# Patient Record
Sex: Male | Born: 1970 | Race: Black or African American | Hispanic: No | Marital: Single | Smoking: Current some day smoker
Health system: Southern US, Community
[De-identification: ages and names within clinical notes are randomized; demographics above are authoritative.]

## PROBLEM LIST (undated history)

## (undated) DIAGNOSIS — S82899A Other fracture of unspecified lower leg, initial encounter for closed fracture: Secondary | ICD-10-CM

## (undated) DIAGNOSIS — I10 Essential (primary) hypertension: Secondary | ICD-10-CM

## (undated) DIAGNOSIS — E119 Type 2 diabetes mellitus without complications: Secondary | ICD-10-CM

## (undated) DIAGNOSIS — E559 Vitamin D deficiency, unspecified: Secondary | ICD-10-CM

## (undated) DIAGNOSIS — E785 Hyperlipidemia, unspecified: Secondary | ICD-10-CM

## (undated) DIAGNOSIS — M109 Gout, unspecified: Secondary | ICD-10-CM

## (undated) DIAGNOSIS — E291 Testicular hypofunction: Secondary | ICD-10-CM

## (undated) HISTORY — DX: Type 2 diabetes mellitus without complications: E11.9

## (undated) HISTORY — DX: Hyperlipidemia, unspecified: E78.5

## (undated) HISTORY — PX: KNEE SURGERY: SHX244

## (undated) HISTORY — DX: Vitamin D deficiency, unspecified: E55.9

## (undated) HISTORY — DX: Testicular hypofunction: E29.1

## (undated) HISTORY — DX: Morbid (severe) obesity due to excess calories: E66.01

---

## 2010-10-20 ENCOUNTER — Emergency Department (HOSPITAL_COMMUNITY): Payer: Managed Care, Other (non HMO)

## 2010-10-20 ENCOUNTER — Emergency Department (HOSPITAL_COMMUNITY)
Admission: EM | Admit: 2010-10-20 | Discharge: 2010-10-20 | Disposition: A | Discharge status: Home / Self Care | Payer: Managed Care, Other (non HMO) | Attending: Emergency Medicine | Admitting: Emergency Medicine

## 2010-10-20 DIAGNOSIS — I10 Essential (primary) hypertension: Secondary | ICD-10-CM | POA: Insufficient documentation

## 2010-10-20 DIAGNOSIS — X500XXA Overexertion from strenuous movement or load, initial encounter: Secondary | ICD-10-CM | POA: Insufficient documentation

## 2010-10-20 DIAGNOSIS — M25569 Pain in unspecified knee: Secondary | ICD-10-CM | POA: Insufficient documentation

## 2010-10-20 DIAGNOSIS — IMO0002 Reserved for concepts with insufficient information to code with codable children: Secondary | ICD-10-CM | POA: Insufficient documentation

## 2016-08-19 ENCOUNTER — Encounter (HOSPITAL_BASED_OUTPATIENT_CLINIC_OR_DEPARTMENT_OTHER): Payer: Self-pay | Admitting: *Deleted

## 2016-08-19 ENCOUNTER — Emergency Department (HOSPITAL_BASED_OUTPATIENT_CLINIC_OR_DEPARTMENT_OTHER): Payer: Managed Care, Other (non HMO)

## 2016-08-19 ENCOUNTER — Emergency Department (HOSPITAL_BASED_OUTPATIENT_CLINIC_OR_DEPARTMENT_OTHER)
Admission: EM | Admit: 2016-08-19 | Discharge: 2016-08-19 | Disposition: A | Discharge status: Home / Self Care | Payer: Managed Care, Other (non HMO) | Attending: Emergency Medicine | Admitting: Emergency Medicine

## 2016-08-19 DIAGNOSIS — I10 Essential (primary) hypertension: Secondary | ICD-10-CM | POA: Diagnosis not present

## 2016-08-19 DIAGNOSIS — F17290 Nicotine dependence, other tobacco product, uncomplicated: Secondary | ICD-10-CM | POA: Insufficient documentation

## 2016-08-19 DIAGNOSIS — M7989 Other specified soft tissue disorders: Secondary | ICD-10-CM

## 2016-08-19 HISTORY — DX: Gout, unspecified: M10.9

## 2016-08-19 HISTORY — DX: Other fracture of unspecified lower leg, initial encounter for closed fracture: S82.899A

## 2016-08-19 HISTORY — DX: Essential (primary) hypertension: I10

## 2016-08-19 NOTE — ED Notes (Signed)
Pt discharged to home with family. NAD.  

## 2016-08-19 NOTE — ED Provider Notes (Signed)
[facility] Note   CSN: [id number] Arrival date & time: 08/19/16  1548   By signing my name below, I, Soijett Blue, attest that this documentation has been prepared under the direction and in the presence of Sharen Hecklaudia Nakiea Metzner, PA-C Electronically Signed: Soijett Blue, ED Scribe. 08/19/16. 6:26 PM.  History   Chief Complaint Chief Complaint  Patient presents with  . Leg Swelling    right    HPI Juan Alvarado is a 46 y.o. male with a PMHx of HTN, gout and obesity who presents to the Emergency Department complaining of gradually worsening, right lower leg swelling onset today. Pt reports associated right ankle swelling x 3 days.  Patient also reports sudden onset cramping sensation behind left knee. Pt has not tried any medications for the relief of his symptoms. He states that he stands for prolonged periods of time (10-12 hours) while at work daily. He denies CP, chest tightness, SOB, orthopnea, cough or h/o heart failure or known CAD.  Denies recent travel, recent trauma, recent injury, immobilization, surgery, PMHx of blood clots, or PMHx of cardiac issues. He reports that he occassionally smokes cigars. Patient reports LE swelling is better in the mornings and worst at the end of the day and after work.   The history is provided by the patient and the spouse. No language interpreter was used.    Past Medical History:  Diagnosis Date  . Ankle fracture    right  . Gout   . Hypertension     There are no active problems to display for this patient.   Past Surgical History:  Procedure Laterality Date  . KNEE SURGERY         Home Medications    Prior to Admission medications   Medication Sig Start Date End Date Taking? Authorizing Provider  Amlodipine-Olmesartan (AZOR PO) Take by mouth.   Yes [provider]  INDOMETHACIN PO Take by mouth.   Yes [provider]  testosterone (ANDROGEL) 50 MG/5GM (1%) GEL Place 5 g onto the skin daily.    Yes [provider]    Family History No family history on file.  Social History Social History  Substance Use Topics  . Smoking status: Current Some Day Smoker    Types: Cigars  . Smokeless tobacco: Never Used  . Alcohol use Yes     Comment: occassional     Allergies   Penicillins   Review of Systems Review of Systems  Respiratory: Negative for chest tightness and shortness of breath.   Cardiovascular: Positive for leg swelling (right lower). Negative for chest pain.  Musculoskeletal: Positive for joint swelling (right ankle). Negative for arthralgias.       +Cramping sensation behind left knee     Physical Exam Updated Vital Signs BP (!) 139/97 (BP Location: Right Arm)   Pulse 79   Temp 98.5 F (36.9 C) (Oral)   Resp (!) 22   Ht 6' (1.829 m)   Wt 252 lb (114.3 kg)   SpO2 100%   BMI 34.18 kg/m   Physical Exam  Constitutional: He is oriented to person, place, and time. He appears well-developed and well-nourished. No distress.  HENT:  Head: Normocephalic and atraumatic.  Eyes: Conjunctivae and EOM are normal. No scleral icterus.  Neck: Normal range of motion.  Cardiovascular: Normal rate, regular rhythm, normal heart sounds and intact distal pulses.   No murmur heard. Asymmetric lower extremity edema R> L. No varicosities seen. No calf tenderness. 2+ and  symmetric radial, dorsalis pedis and posterior tibial pulses bilaterally.   SBP and HR wnl. No JVD with head of bed at 30 degrees. Carotid pulses 2+ bilaterally without bruits. Good S1 and S2. No extra sounds or murmurs. Patient denied shortness of breath, throat congestion or difficulty breathing with head of the bed flat. No orthopnea.  Good lung sounds without crackles.  Pulmonary/Chest: Effort normal and breath sounds normal. He has no wheezes.  Musculoskeletal: Normal range of motion. He exhibits edema. He exhibits no deformity.       Right shoulder: He exhibits swelling.       Left upper  leg: He exhibits tenderness.       Right lower leg: He exhibits edema.  Mild right lower leg edema that is non-pitting. No overlying or surrounding skin changes, bruising, or abrasions.  Mild left popliteal space tenderness, no noted masses or bulges  Lymphadenopathy:    He has no cervical adenopathy.  Neurological: He is alert and oriented to person, place, and time.  Gait normal. No foot drop.  5/5 strength with knee flexion and extension, bilaterally.  5/5 strength with ankle dorsiflexion and plantar flexion, bilaterally.  Sensation to light touch intact in the distribution of the obturator nerve, lateral cutaneous nerve, femoral nerve, common fibular nerve.   Foot: sensation to light touch intact in the distribution of the saphenous nerve, medial plantar nerve, lateral plantar nerve, bilaterally.   Skin: Skin is warm and dry. Capillary refill takes less than 2 seconds.  Psychiatric: He has a normal mood and affect. His behavior is normal. Judgment and thought content normal.  Nursing note and vitals reviewed.    ED Treatments / Results  DIAGNOSTIC STUDIES: Oxygen Saturation is 100% on RA, nl by my interpretation.    COORDINATION OF CARE: 8:55 PM Discussed treatment plan with pt at bedside which includes US venous lower bilateral and pt agreed to plan.   Labs (all labs ordered are listed, but only abnormal results are displayed) Labs Reviewed - No data to display  EKG  EKG Interpretation None       Radiology US Venous Img Lower Bilateral  Result Date: 08/19/2016 CLINICAL DATA:  46 year old male with acute bilateral lower leg pain and swelling for 3 days. Initial encounter. EXAM: BILATERAL LOWER EXTREMITY VENOUS DOPPLER ULTRASOUND TECHNIQUE: Gray-scale sonography with graded compression, as well as color Doppler and duplex ultrasound were performed to evaluate the lower extremity deep venous systems from the level of the common femoral vein and including the common femoral,  femoral, profunda femoral, popliteal and calf veins including the posterior tibial, peroneal and gastrocnemius veins when visible. The superficial great saphenous vein was also interrogated. Spectral Doppler was utilized to evaluate flow at rest and with distal augmentation maneuvers in the common femoral, femoral and popliteal veins. COMPARISON:  None. FINDINGS: RIGHT LOWER EXTREMITY Normal flow, compressibility, and augmentation within the distal common femoral, proximal profunda femoral, proximal greater saphenous, entire femoral, popliteal veins, and imaged calf veins. LEFT LOWER EXTREMITY Normal flow, compressibility, and augmentation within the distal common femoral, proximal profunda femoral, proximal greater saphenous, entire femoral, popliteal veins, and imaged calf veins. IMPRESSION: No evidence of DVT within either lower extremity. Electronically Signed   By: Harmon Pier M.D.   On: 08/19/2016 17:34    Procedures Procedures (including critical care time)  Medications Ordered in ED Medications - No data to display   Initial Impression / Assessment and Plan / ED Course  I have reviewed the triage vital  signs and the nursing notes.  Pertinent labs & imaging results that were available during my care of the patient were reviewed by me and considered in my medical decision making (see chart for details).    46 year old male with history of hypertension, gout and obesity presents with gradually worsening right lower extremity edema 3 days. The patient also reports sudden onset left popliteal space "cramping" sensation since yesterday. No history of DVT/PE. No history of congestive heart failure or known CAD. Blood pressure is well-controlled with medications. No trauma. No fevers. No IVDU.  On my exam vital signs are within normal limits. Patient is nontoxic appearing. There is mild, nonpitting right lower extremity edema from ankle to knee, which is nontender. No overlying skin changes to  suggest cellulitis. No point tenderness over the lower extremity joints to suggest gout. Patient reports very mild tenderness to left popliteal space, no masses or bulges. Cardiac exam is normal, no crackles on lung exam, no orthopnea.  Ultrasound of bilateral lower extremities ordered and negative for DVT. Highly suspecting venous insufficiency given history of patient working on his feet for 10-12 hours daily. Lower extremity swelling does also improve in the mornings and worsens at the end of the day. Further emergent lab work or imaging indicated at this time. Patient will be discharged and advised to follow-up with PCP.  Patient may benefit from lasix which could be started and monitored by PCP. Will advised to wear compression stockings, elevate feet at the end of the day. ED return fashions given.  Final Clinical Impressions(s) / ED Diagnoses   Final diagnoses:  Leg swelling    New Prescriptions Discharge Medication List as of 08/19/2016  7:06 PM     I personally performed the services described in this documentation, which was scribed in my presence. The recorded information has been reviewed and is accurate.     Liberty Handy, PA-C 08/19/16 2055    Raeford Razor, MD 08/21/16 1028

## 2016-08-19 NOTE — Discharge Instructions (Signed)
Your ultrasound of your legs did not show a deep venous thrombosis (blood clot in your veins).  The most likely cause of your lower extremity swelling is venous insufficiency.    Please wear compression socks while standing up and working.  Elevate your feet during the day and especially at night time.  Contact your primary care provider as he may suggest starting fluid pills and possible referral to vascular specialist.   Please read attached information on edema, venous insufficiency and deep venous thrombosis. Monitor your symptoms and return to the emergency department if you develop pain or cramps in your calves as this may be due to a blood clot in your legs. Read the attached information so you are aware of symptoms that would warrant return to the emergency department for further evaluation.

## 2016-08-19 NOTE — ED Triage Notes (Signed)
Patient states he has a three day history of right ankle swelling.  States this morning he developed swelling in the right lower leg.  Denies pain in the right leg.  States this morning he woke up with a constant cramp behind left knee.

## 2016-08-25 ENCOUNTER — Other Ambulatory Visit: Payer: Self-pay | Admitting: *Deleted

## 2016-08-25 ENCOUNTER — Encounter: Payer: Self-pay | Admitting: Vascular Surgery

## 2016-08-25 DIAGNOSIS — I872 Venous insufficiency (chronic) (peripheral): Secondary | ICD-10-CM

## 2016-09-20 ENCOUNTER — Encounter: Payer: Self-pay | Admitting: Vascular Surgery

## 2016-09-26 ENCOUNTER — Ambulatory Visit (INDEPENDENT_AMBULATORY_CARE_PROVIDER_SITE_OTHER): Payer: Managed Care, Other (non HMO) | Admitting: Vascular Surgery

## 2016-09-26 ENCOUNTER — Ambulatory Visit (HOSPITAL_COMMUNITY)
Admission: RE | Admit: 2016-09-26 | Discharge: 2016-09-26 | Disposition: A | Discharge status: Home / Self Care | Payer: Managed Care, Other (non HMO) | Source: Ambulatory Visit | Attending: Vascular Surgery | Admitting: Vascular Surgery

## 2016-09-26 ENCOUNTER — Encounter: Payer: Self-pay | Admitting: Vascular Surgery

## 2016-09-26 VITALS — BP 126/78 | HR 68 | Temp 97.8°F | Resp 18 | Ht 72.0 in | Wt 250.0 lb

## 2016-09-26 DIAGNOSIS — M7989 Other specified soft tissue disorders: Secondary | ICD-10-CM | POA: Diagnosis not present

## 2016-09-26 DIAGNOSIS — I872 Venous insufficiency (chronic) (peripheral): Secondary | ICD-10-CM | POA: Diagnosis present

## 2016-09-26 NOTE — Progress Notes (Signed)
[facility]  Patient name: Juan PearKyle Alvarado MRN: [id number] DOB: 07-25-1970 Sex: male  REASON FOR CONSULT: Evaluation swelling right leg  HPI: Juan PearKyle Alvarado is a 46 y.o. male, who is here today for evaluation swelling. He is very pleasant gentleman who reports increased swelling in his right leg. On further questioning he does note some swelling in his left leg as well. He had knee surgery proctoring 5 years ago and reports this is been somewhat progressive since that time. He also reports fracture of his right ankle years prior to this. He has no history of DVT and no history of venous varicosities. No history of cardiac disease and no history of lower extremity arterial insufficiency. 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 He does wear knee-high compression garments and see some relief from this.  Past Medical History:  Diagnosis Date  . Ankle fracture    right  . Diabetes mellitus without complication (HCC)   . Gout   . Gout   . Hyperlipidemia   . Hypertension   . Hypogonadism, testicular   . Morbid obesity (HCC)   . Vitamin D deficiency     History reviewed. No pertinent family history.  SOCIAL HISTORY: Social History   Social History  . Marital status: Single    Spouse name: N/A  . Number of children: N/A  . Years of education: N/A   Occupational History  . Not on file.   Social History Main Topics  . Smoking status: Current Some Day Smoker    Types: Cigars  . Smokeless tobacco: Never Used  . Alcohol use Yes     Comment: occassional  . Drug use: Unknown  . Sexual activity: Not on file   Other Topics Concern  . Not on file   Social History Narrative  . No narrative on file    Allergies  Allergen Reactions  . Penicillins     Current Outpatient Prescriptions  Medication Sig Dispense Refill  . Amlodipine-Olmesartan (AZOR PO) Take by mouth.    Marland Kitchen.  aspirin EC 81 MG tablet Take 81 mg by mouth daily.    . Cholecalciferol (VITAMIN D) 2000 units CAPS Take 50,000 Units by mouth once a week.    . INDOMETHACIN PO Take by mouth.    . testosterone (ANDROGEL) 50 MG/5GM (1%) GEL Place 5 g onto the skin daily.     No current facility-administered medications for this visit.     REVIEW OF SYSTEMS:  [X]  denotes positive finding, [ ]  denotes negative finding Cardiac  Comments:  Chest pain or chest pressure:    Shortness of breath upon exertion:    Short of breath when lying flat:    Irregular heart rhythm:        Vascular    Pain in calf, thigh, or hip brought on by ambulation:    Pain in feet at night that wakes you up from your sleep:     Blood clot in your veins:    Leg swelling:  x       Pulmonary    Oxygen at home:    Productive cough:     Wheezing:         Neurologic    Sudden weakness in arms or legs:     Sudden numbness in arms or legs:     Sudden onset of difficulty speaking or slurred speech:  Temporary loss of vision in one eye:     Problems with dizziness:         Gastrointestinal    Blood in stool:     Vomited blood:         Genitourinary    Burning when urinating:     Blood in urine:        Psychiatric    Major depression:         Hematologic    Bleeding problems:    Problems with blood clotting too easily:        Skin    Rashes or ulcers:        Constitutional    Fever or chills:      PHYSICAL EXAM: Vitals:   09/26/16 1442  BP: 126/78  Pulse: 68  Resp: 18  Temp: 97.8 F (36.6 C)  SpO2: 98%  Weight: 250 lb (113.4 kg)  Height: 6' (1.829 m)    GENERAL: The patient is a well-nourished male, in no acute distress. The vital signs are documented above. CARDIOVASCULAR: 2+ dorsalis pedis pulses bilaterally. He does have pitting edema pretibial on both legs. Slightly more swelling in his right leg and on his left. No varicosities or telangiectasia present. PULMONARY: There is good air exchange    ABDOMEN: Soft and non-tender  MUSCULOSKELETAL: There are no major deformities or cyanosis. NEUROLOGIC: No focal weakness or paresthesias are detected. SKIN: There are no ulcers or rashes noted. PSYCHIATRIC: The patient has a normal affect.  DATA:  Right leg duplex shows no evidence of DVT. He does have reflux in his common femoral vein and femoral vein. No superficial venous reflux  MEDICAL ISSUES: Had long discussion with patient. Explained this is not dangerous or limb threatening. Explained that he's got normal arterial flow. He is questioning whether weight loss would improve this and I explained that it would. Also explained the critical importance of a compression garments and leg elevation when possible. He was relieved with this discussion will see [place] again on as-needed basis   Larina Earthly, MD [facility] Tel [phone] Pager [phone]

## 2017-05-19 IMAGING — US US EXTREM LOW VENOUS BILAT
1 series · 13 of 24 positions shown · non-contrast
Comparison: None.

CLINICAL DATA: 45-year-old male with acute bilateral lower leg pain
and swelling for 3 days. Initial encounter.



[Series 1: us extrem low venous bilat · 0.08mm/px · 13 of 63 slices shown]
[im 1/63]
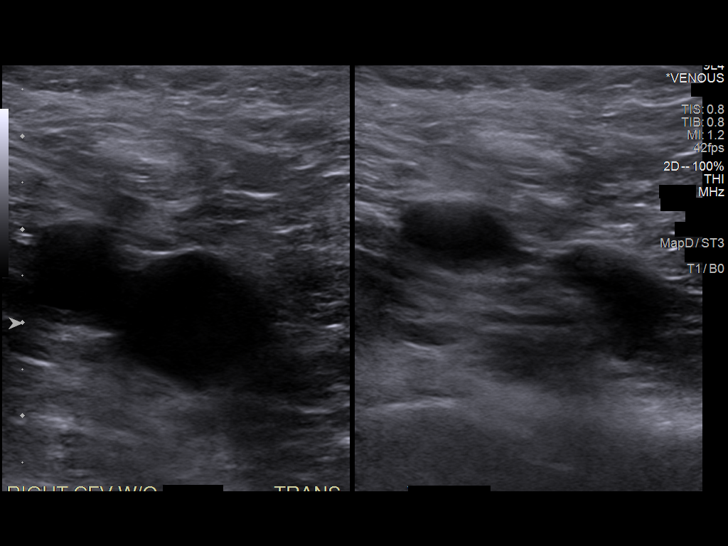
[im 6/63]
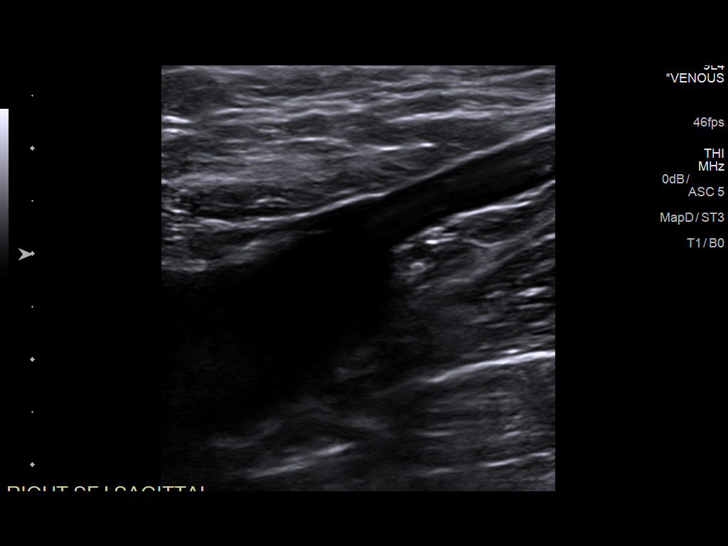
[im 11/63]
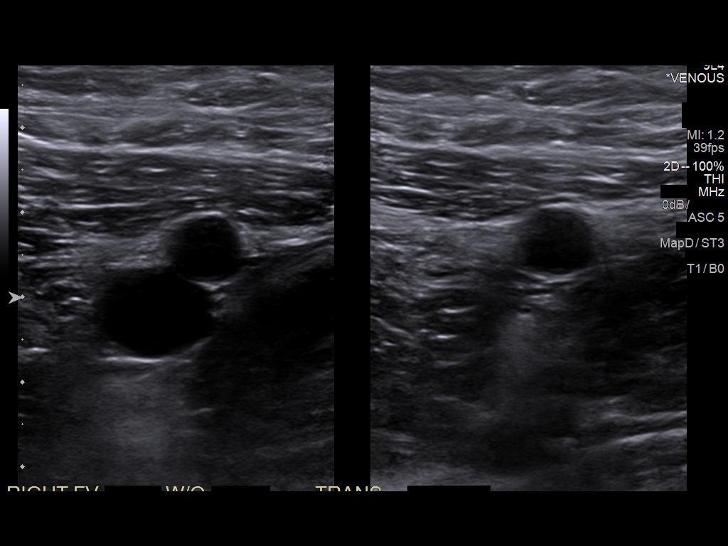
[im 17/63]
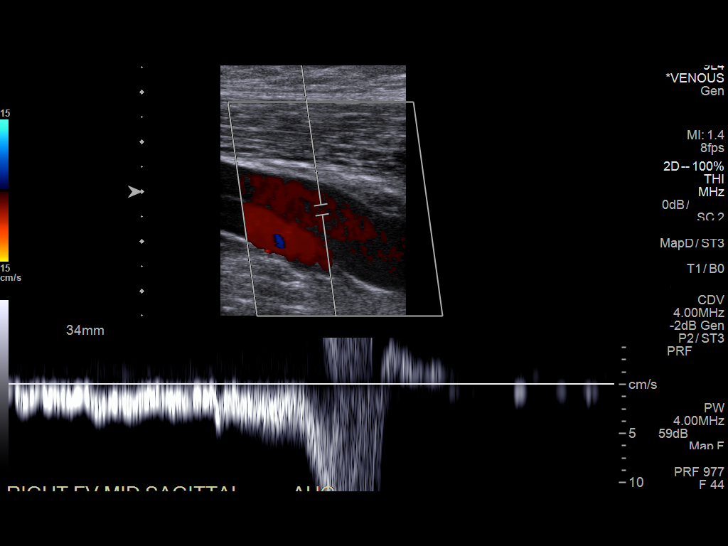
[im 22/63]
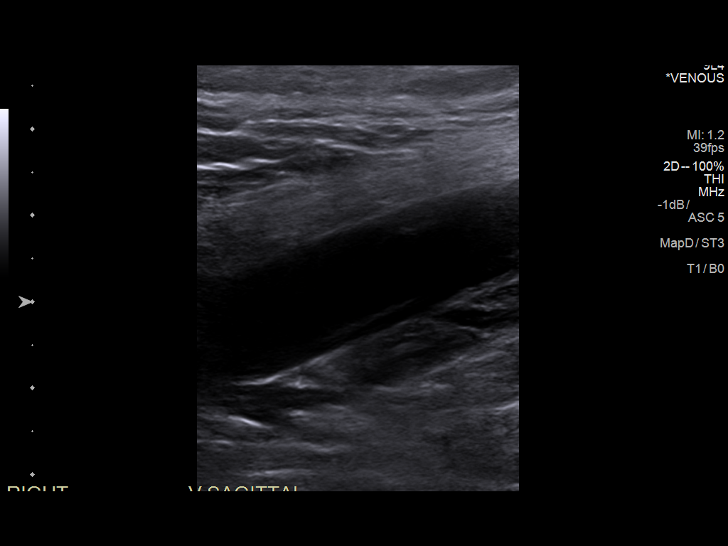
[im 27/63]
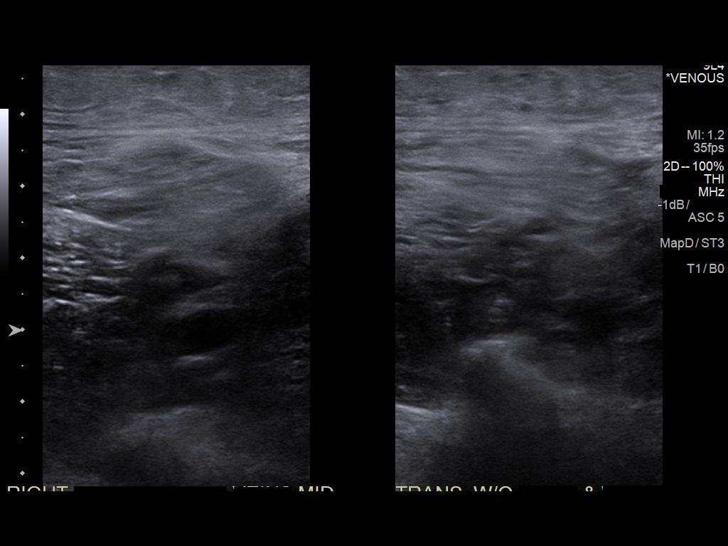
[im 33/63]
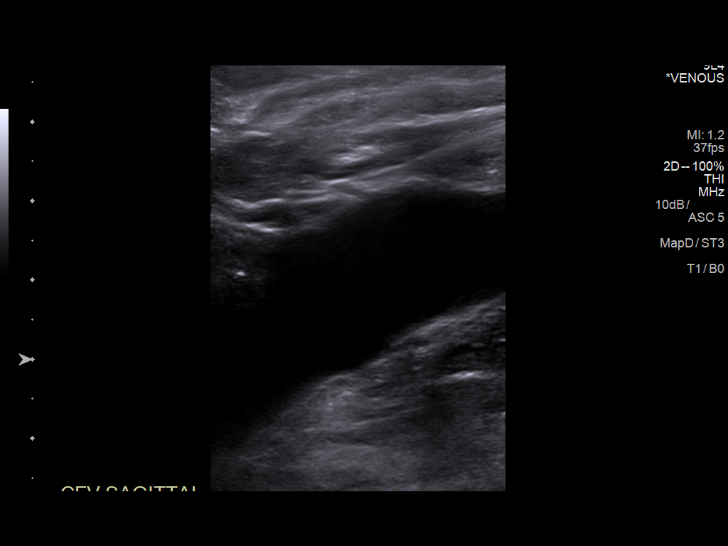
[im 36/63]
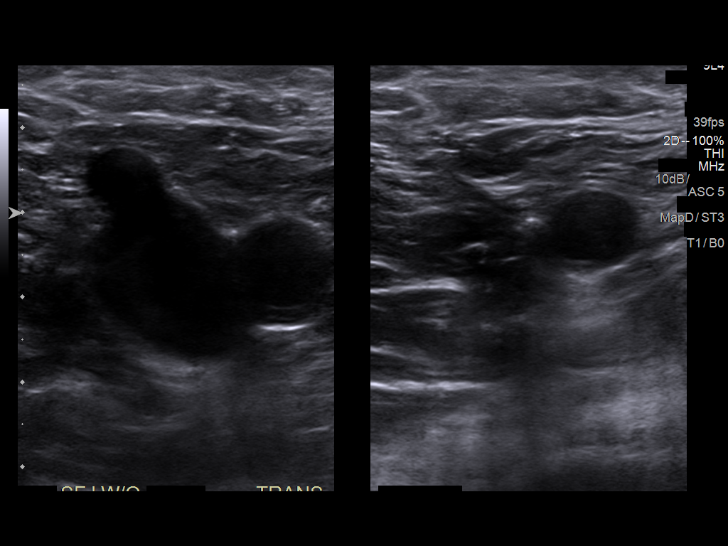
[im 41/63]
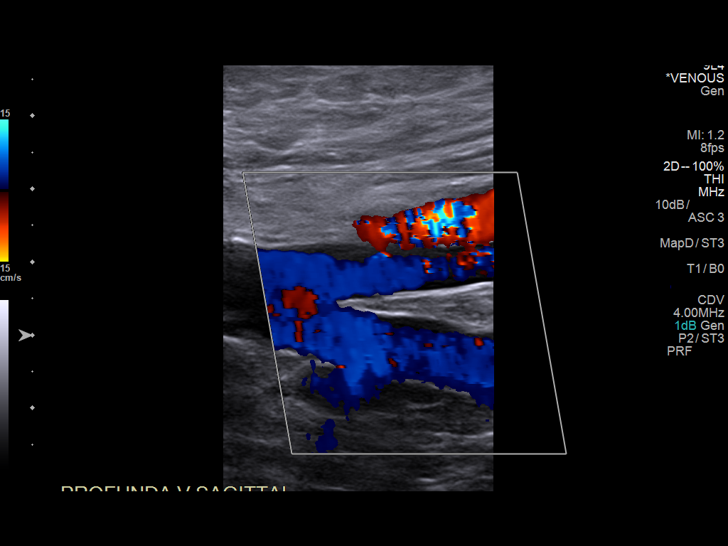
[im 46/63]
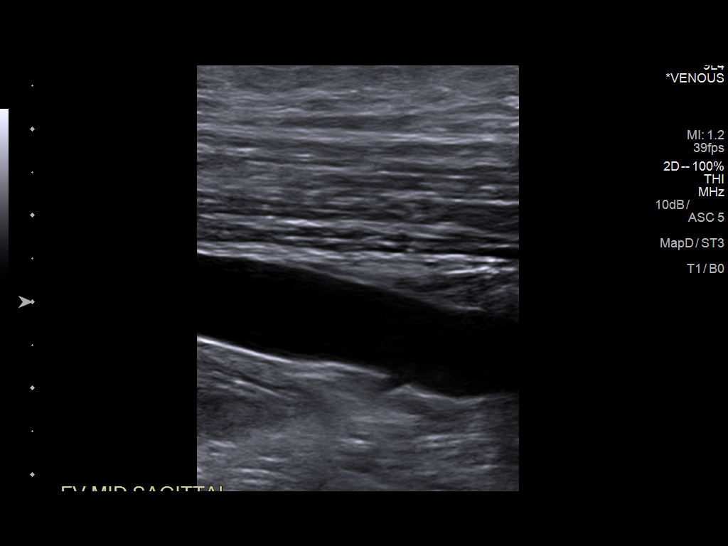
[im 52/63]
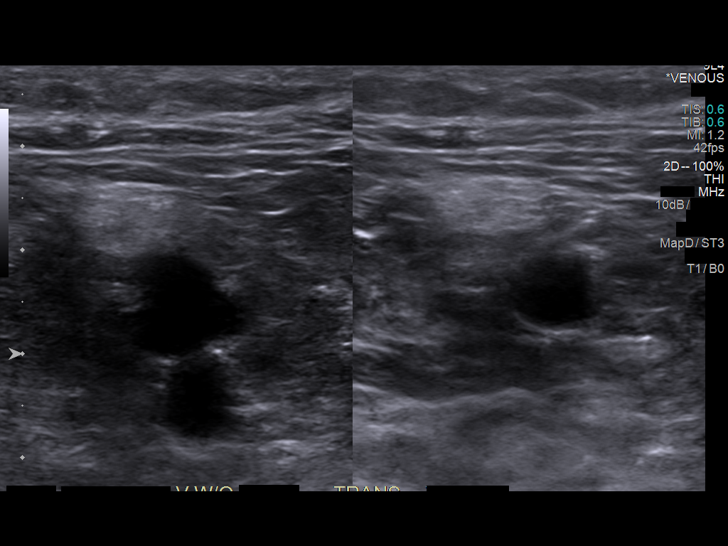
[im 57/63]
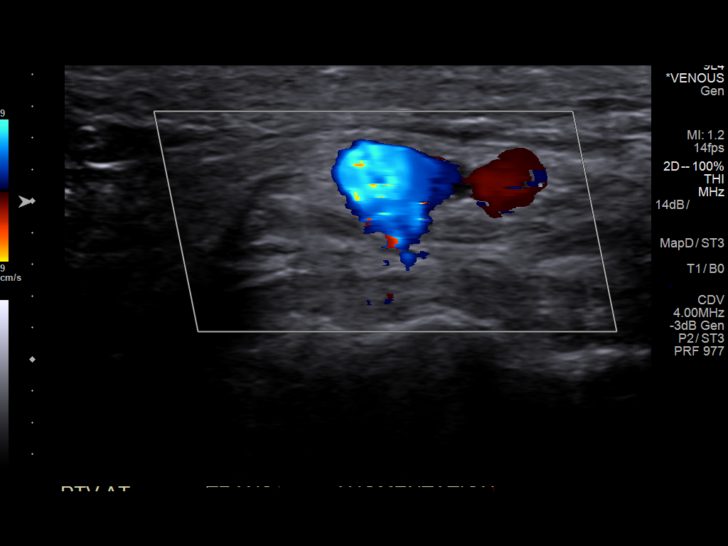
[im 63/63]
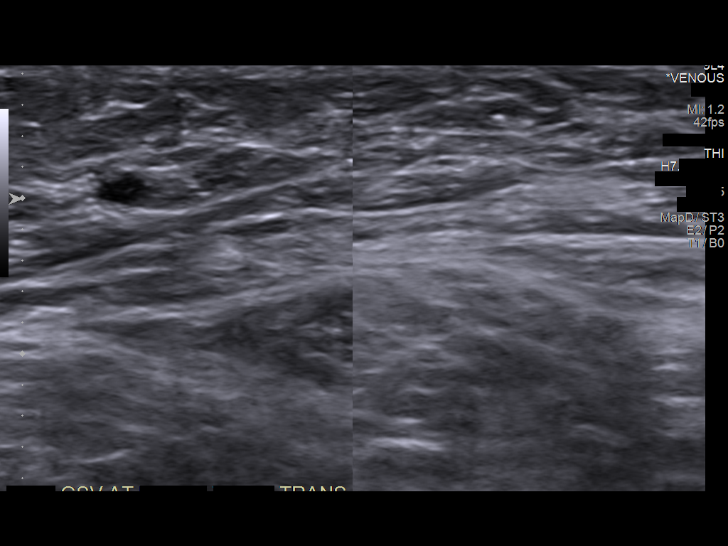

[13 of 24 positions shown; findings below may reference images not displayed]

FINDINGS: RIGHT LOWER EXTREMITY

Normal flow, compressibility, and augmentation within the distal
common femoral, proximal profunda femoral, proximal greater
saphenous, entire femoral, popliteal veins, and imaged calf veins.

LEFT LOWER EXTREMITY

Normal flow, compressibility, and augmentation within the distal
common femoral, proximal profunda femoral, proximal greater
saphenous, entire femoral, popliteal veins, and imaged calf veins.
IMPRESSION: No evidence of DVT within either lower extremity.

## 2019-03-10 ENCOUNTER — Other Ambulatory Visit: Payer: Self-pay

## 2019-03-10 ENCOUNTER — Encounter (HOSPITAL_BASED_OUTPATIENT_CLINIC_OR_DEPARTMENT_OTHER): Payer: Self-pay | Admitting: Emergency Medicine

## 2019-03-10 ENCOUNTER — Emergency Department (HOSPITAL_BASED_OUTPATIENT_CLINIC_OR_DEPARTMENT_OTHER): Payer: Managed Care, Other (non HMO)

## 2019-03-10 ENCOUNTER — Emergency Department (HOSPITAL_BASED_OUTPATIENT_CLINIC_OR_DEPARTMENT_OTHER)
Admission: EM | Admit: 2019-03-10 | Discharge: 2019-03-10 | Disposition: A | Discharge status: Home / Self Care | Payer: Managed Care, Other (non HMO) | Attending: Emergency Medicine | Admitting: Emergency Medicine

## 2019-03-10 DIAGNOSIS — E119 Type 2 diabetes mellitus without complications: Secondary | ICD-10-CM | POA: Insufficient documentation

## 2019-03-10 DIAGNOSIS — I10 Essential (primary) hypertension: Secondary | ICD-10-CM | POA: Diagnosis not present

## 2019-03-10 DIAGNOSIS — M109 Gout, unspecified: Secondary | ICD-10-CM | POA: Diagnosis not present

## 2019-03-10 DIAGNOSIS — M2550 Pain in unspecified joint: Secondary | ICD-10-CM | POA: Diagnosis present

## 2019-03-10 LAB — CBC WITH DIFFERENTIAL/PLATELET
Abs Immature Granulocytes: 0.11 10*3/uL — ABNORMAL HIGH (ref 0.00–0.07)
Basophils Absolute: 0.1 10*3/uL (ref 0.0–0.1)
Basophils Relative: 1 %
Eosinophils Absolute: 0.1 10*3/uL (ref 0.0–0.5)
Eosinophils Relative: 1 %
HCT: 35.9 % — ABNORMAL LOW (ref 39.0–52.0)
Hemoglobin: 11.2 g/dL — ABNORMAL LOW (ref 13.0–17.0)
Immature Granulocytes: 1 %
Lymphocytes Relative: 17 %
Lymphs Abs: 1.9 10*3/uL (ref 0.7–4.0)
MCH: 26.5 pg (ref 26.0–34.0)
MCHC: 31.2 g/dL (ref 30.0–36.0)
MCV: 85.1 fL (ref 80.0–100.0)
Monocytes Absolute: 1.1 10*3/uL — ABNORMAL HIGH (ref 0.1–1.0)
Monocytes Relative: 9 %
Neutro Abs: 8.2 10*3/uL — ABNORMAL HIGH (ref 1.7–7.7)
Neutrophils Relative %: 71 %
Platelets: 515 10*3/uL — ABNORMAL HIGH (ref 150–400)
RBC: 4.22 MIL/uL (ref 4.22–5.81)
RDW: 14.2 % (ref 11.5–15.5)
WBC: 11.5 10*3/uL — ABNORMAL HIGH (ref 4.0–10.5)
nRBC: 0 % (ref 0.0–0.2)

## 2019-03-10 LAB — BASIC METABOLIC PANEL
Anion gap: 11 (ref 5–15)
BUN: 24 mg/dL — ABNORMAL HIGH (ref 6–20)
CO2: 24 mmol/L (ref 22–32)
Calcium: 9.1 mg/dL (ref 8.9–10.3)
Chloride: 102 mmol/L (ref 98–111)
Creatinine, Ser: 1.18 mg/dL (ref 0.61–1.24)
GFR calc Af Amer: >60 mL/min (ref >60)
GFR calc non Af Amer: >60 mL/min (ref >60)
Glucose, Bld: 112 mg/dL — ABNORMAL HIGH (ref 70–99)
Potassium: 4.3 mmol/L (ref 3.5–5.1)
Sodium: 137 mmol/L (ref 135–145)

## 2019-03-10 LAB — URIC ACID: Uric Acid, Serum: 9.8 mg/dL — ABNORMAL HIGH (ref 3.7–8.6)

## 2019-03-10 MED ORDER — OXYCODONE-ACETAMINOPHEN 5-325 MG PO TABS
1.0000 | ORAL_TABLET | Freq: Once | ORAL | Status: AC
  Administered 2019-03-10: 1 via ORAL
  Filled 2019-03-10: qty 1

## 2019-03-10 MED ORDER — OXYCODONE-ACETAMINOPHEN 5-325 MG PO TABS: 1.0000 | ORAL_TABLET | ORAL | 0 refills | Status: DC | PRN

## 2019-03-10 MED ORDER — PREDNISONE 10 MG (21) PO TBPK: ORAL_TABLET | ORAL | 0 refills | Status: DC

## 2019-03-10 NOTE — ED Triage Notes (Signed)
Reports gout flair for about a week.  Started taking prednisone about a week ago.  Woke up this morning significantly worse.  Unable to bear weight.  Pain in hands, elbows, ankles, knees, and feet.

## 2019-03-10 NOTE — ED Provider Notes (Signed)
[facility] EMERGENCY DEPARTMENT Provider Note   CSN: [id number] Arrival date & time: 03/10/19  0554     History   Chief Complaint Chief Complaint  Patient presents with   Gout    HPI Juan Alvarado is a 48 y.o. male.     HPI  This a 48 year old male with history of diabetes, gout, hypertension, hyperlipidemia who presents with polyarthralgias.  Patient reports 6-day history of worsening knee pain, right hand pain, elbow pain.  He states normally his gout flares are in 1 joint.  It started off in his left knee.  He subsequently has developed "lesions on my fingers" and pain "all over."  He reports pain in his bilateral toes, left knee, right hand and elbow.  He is not had any fevers or other systemic symptoms.  He normally takes indomethacin for his gout.  He is currently taking prednisone which she has been taking from the onset.  He is rating his pain at 10 out of 10.  Is worse with ambulation.  Patient does report that prior to onset of symptoms he may have deviated from his diet with an increase of pork and alcohol.  Past Medical History:  Diagnosis Date   Ankle fracture    right   Diabetes mellitus without complication (HCC)    Gout    Gout    Hyperlipidemia    Hypertension    Hypogonadism, testicular    Morbid obesity (HCC)    Vitamin D deficiency     There are no active problems to display for this patient.   Past Surgical History:  Procedure Laterality Date   KNEE SURGERY          Home Medications    Prior to Admission medications   Medication Sig Start Date End Date Taking? Authorizing Provider  Amlodipine-Olmesartan (AZOR PO) Take by mouth.   Yes [provider]  Cholecalciferol (VITAMIN D) 2000 units CAPS Take 50,000 Units by mouth once a week.   Yes [provider]  INDOMETHACIN PO Take by mouth.   Yes [provider]  testosterone (ANDROGEL) 50 MG/5GM (1%) GEL Place 5 g onto the skin daily.   Yes  [provider]  aspirin EC 81 MG tablet Take 81 mg by mouth daily.    [provider]    Family History No family history on file.  Social History Social History   Tobacco Use   Smoking status: Current Some Day Smoker    Types: Cigars   Smokeless tobacco: Never Used  Substance Use Topics   Alcohol use: Yes    Comment: occassional   Drug use: Never     Allergies   Penicillins   Review of Systems Review of Systems  Constitutional: Negative for fever.  Respiratory: Negative for shortness of breath.   Cardiovascular: Negative for chest pain.  Musculoskeletal:       Multiple joint pain  Skin: Negative for color change.  All other systems reviewed and are negative.    Physical Exam Updated Vital Signs BP 130/86 (BP Location: Right Arm)    Pulse 89    Temp 99.1 F (37.3 C) (Oral)    Resp 18    Ht 1.829 m (6')    Wt 115.7 kg    SpO2 100%    BMI 34.58 kg/m   Physical Exam Vitals signs and nursing note reviewed.  Constitutional:      Appearance: He is well-developed. He is obese. He is not ill-appearing.  HENT:     Head: Normocephalic and atraumatic.  Eyes:     Pupils: Pupils are equal, round, and reactive to light.  Neck:     Musculoskeletal: Neck supple.  Cardiovascular:     Rate and Rhythm: Normal rate and regular rhythm.     Heart sounds: Normal heart sounds. No murmur.  Pulmonary:     Effort: Pulmonary effort is normal. No respiratory distress.     Breath sounds: Normal breath sounds. No wheezing.  Abdominal:     Palpations: Abdomen is soft.     Tenderness: There is no abdominal tenderness.  Musculoskeletal:     Comments: Multiple swollen joints noted on exam Left knee swollen with effusion, no overlying skin changes or erythema, no warmth, limited range of motion secondary to pain, patient with swelling of the right second and fifth interphalangeal joints with limited flexion, swollen bilateral MTP joints with no significant overlying  erythema or warmth  Lymphadenopathy:     Cervical: No cervical adenopathy.  Skin:    General: Skin is warm and dry.  Neurological:     Mental Status: He is alert and oriented to person, place, and time.  Psychiatric:        Mood and Affect: Mood normal.      ED Treatments / Results  Labs (all labs ordered are listed, but only abnormal results are displayed) Labs Reviewed  CBC WITH DIFFERENTIAL/PLATELET  BASIC METABOLIC PANEL  URIC ACID    EKG None  Radiology No results found.  Procedures Procedures (including critical care time)  Medications Ordered in ED Medications  oxyCODONE-acetaminophen (PERCOCET/ROXICET) 5-325 MG per tablet 1 tablet (has no administration in time range)     Initial Impression / Assessment and Plan / ED Course  I have reviewed the triage vital signs and the nursing notes.  Pertinent labs & imaging results that were available during my care of the patient were reviewed by me and considered in my medical decision making (see chart for details).        Patient presents with polyarthropathy.  Denies systemic symptoms.  Normally his gout is in 1 joint.  He is overall nontoxic-appearing and vital signs are reassuring.  He has what appears to be tophi over his fingers and warmth of the MTP joints which would be most specific to gout.  However, noninflammatory causes such as infectious causes are considered.  He denies any infectious symptoms.  Basic lab work obtained including uric acid level.  Will obtain an x-ray of  the right hand to identify whether there are tophi present which he states are new.  Patient signed out to oncoming provider.  Final Clinical Impressions(s) / ED Diagnoses   Final diagnoses:  None    ED Discharge Orders    None       Raiven Belizaire, Barbette Hair, MD 03/10/19 [phone]

## 2019-03-10 NOTE — ED Provider Notes (Signed)
Pt signed out by Dr. Dina Rich.  He is feeling better after the percocet.  He will be d/c home with a higher prednisone taper (he is only on 20 mg now) as well as a short course of percocet.  Pt is stable for d/c.  He knows to eat a low purine diet.  Return if worse.   Isla Pence, MD 03/10/19 [phone]

## 2019-04-11 ENCOUNTER — Emergency Department (HOSPITAL_BASED_OUTPATIENT_CLINIC_OR_DEPARTMENT_OTHER)
Admission: EM | Admit: 2019-04-11 | Discharge: 2019-04-12 | Disposition: A | Discharge status: Home / Self Care | Payer: Managed Care, Other (non HMO) | Attending: Emergency Medicine | Admitting: Emergency Medicine

## 2019-04-11 ENCOUNTER — Other Ambulatory Visit: Payer: Self-pay

## 2019-04-11 ENCOUNTER — Encounter (HOSPITAL_BASED_OUTPATIENT_CLINIC_OR_DEPARTMENT_OTHER): Payer: Self-pay | Admitting: *Deleted

## 2019-04-11 ENCOUNTER — Emergency Department (HOSPITAL_BASED_OUTPATIENT_CLINIC_OR_DEPARTMENT_OTHER): Payer: Managed Care, Other (non HMO)

## 2019-04-11 DIAGNOSIS — Z79899 Other long term (current) drug therapy: Secondary | ICD-10-CM | POA: Diagnosis not present

## 2019-04-11 DIAGNOSIS — M2550 Pain in unspecified joint: Secondary | ICD-10-CM | POA: Diagnosis present

## 2019-04-11 DIAGNOSIS — I10 Essential (primary) hypertension: Secondary | ICD-10-CM | POA: Diagnosis not present

## 2019-04-11 DIAGNOSIS — F17290 Nicotine dependence, other tobacco product, uncomplicated: Secondary | ICD-10-CM | POA: Diagnosis not present

## 2019-04-11 DIAGNOSIS — Z20828 Contact with and (suspected) exposure to other viral communicable diseases: Secondary | ICD-10-CM | POA: Insufficient documentation

## 2019-04-11 DIAGNOSIS — R509 Fever, unspecified: Secondary | ICD-10-CM

## 2019-04-11 DIAGNOSIS — M109 Gout, unspecified: Secondary | ICD-10-CM | POA: Diagnosis not present

## 2019-04-11 DIAGNOSIS — Z7982 Long term (current) use of aspirin: Secondary | ICD-10-CM | POA: Diagnosis not present

## 2019-04-11 DIAGNOSIS — E119 Type 2 diabetes mellitus without complications: Secondary | ICD-10-CM | POA: Insufficient documentation

## 2019-04-11 MED ORDER — ACETAMINOPHEN 500 MG PO TABS
1000.0000 mg | ORAL_TABLET | Freq: Once | ORAL | Status: AC
  Administered 2019-04-12: 1000 mg via ORAL
  Filled 2019-04-11: qty 2

## 2019-04-11 MED ORDER — SODIUM CHLORIDE 0.9 % IV BOLUS
1000.0000 mL | Freq: Once | INTRAVENOUS | Status: AC
  Administered 2019-04-12: 1000 mL via INTRAVENOUS

## 2019-04-11 NOTE — ED Triage Notes (Signed)
Pt. Reports 2 days ago pain started in shoulders, knees, ankles, great toes and L side was worse than the R with pain in all joints.  Pt. Reports joints were hot and still are.  Pt. Has  A fever with reports of no nausea or diarrhea.

## 2019-04-11 NOTE — ED Provider Notes (Signed)
[facility]ENC[facility] DEPARTMENT Provider Note   CSN: [id number] Arrival date & time: 04/11/19  2310     History Chief Complaint  Patient presents with  . Leg Pain  . Fever    Juan Alvarado is a 48 y.o. male.  Patient is a 48 year old male with past medical history of hypertension, hyperlipidemia, gout.  He presents today for evaluation of joint pain.  Patient describes pain in his ankles, knees, toes, and shoulders that has worsened over the past 2 days.  Patient has history of gout and initially thought this was the cause.  Patient arrives here febrile with temp of 102.  He denies any specific complaints that would explain his fever such as cough, abdominal pain, diarrhea, urinary complaints.  He denies any ill contacts or exposures.  The history is provided by the patient.       Past Medical History:  Diagnosis Date  . Ankle fracture    right  . Diabetes mellitus without complication ([place])   . Gout   . Gout   . Hyperlipidemia   . Hypertension   . Hypogonadism, testicular   . Morbid obesity ([place])   . Vitamin D deficiency     There are no problems to display for this patient.   Past Surgical History:  Procedure Laterality Date  . KNEE SURGERY         No family history on file.  Social History   Tobacco Use  . Smoking status: Current Some Day Smoker    Types: Cigars  . Smokeless tobacco: Never Used  Substance Use Topics  . Alcohol use: Yes    Comment: occassional  . Drug use: Never    Home Medications Prior to Admission medications   Medication Sig Start Date End Date Taking? Authorizing Provider  oxyCODONE-acetaminophen (PERCOCET/ROXICET) 5-325 MG tablet Take 1 tablet by mouth every 4 (four) hours as needed for severe pain. 03/10/19  Yes Isla Pence, MD  Amlodipine-Olmesartan (AZOR PO) Take by mouth.    [provider]  aspirin EC 81 MG tablet Take 81 mg by mouth daily.    [provider]  Cholecalciferol (VITAMIN D)  2000 units CAPS Take 50,000 Units by mouth once a week.    [provider]  INDOMETHACIN PO Take by mouth.    [provider]  predniSONE (STERAPRED UNI-PAK 21 TAB) 10 MG (21) TBPK tablet Take 6 tabs for 2 days, then 5 for 2 days, then 4 for 2 days, then 3 for 2 days, 2 for 2 days, then 1 for 2 days 03/10/19   Isla Pence, MD  testosterone (ANDROGEL) 50 MG/5GM (1%) GEL Place 5 g onto the skin daily.    [provider]    Allergies    Penicillins  Review of Systems   Review of Systems  All other systems reviewed and are negative.   Physical Exam Updated Vital Signs BP (!) 156/91 (BP Location: Right Arm)   Pulse (!) 108   Temp (!) 102 F (38.9 C) (Oral)   Resp (!) 24   Ht 6' (1.829 m)   Wt 115.7 kg   SpO2 98%   BMI 34.58 kg/m   Physical Exam Vitals and nursing note reviewed.  Constitutional:      General: He is not in acute distress.    Appearance: He is well-developed. He is not diaphoretic.  HENT:     Head: Normocephalic and atraumatic.  Cardiovascular:     Rate and Rhythm: Normal rate  and regular rhythm.     Heart sounds: No murmur. No friction rub.  Pulmonary:     Effort: Pulmonary effort is normal. No respiratory distress.     Breath sounds: Normal breath sounds. No wheezing or rales.  Abdominal:     General: Bowel sounds are normal. There is no distension.     Palpations: Abdomen is soft.     Tenderness: There is no abdominal tenderness.  Musculoskeletal:        General: Swelling present. Normal range of motion.     Cervical back: Normal range of motion and neck supple.     Right lower leg: Edema present.     Comments: There is swelling to the bilateral lower extremities in a stocking distribution.  The right is more swollen than the left.  DP pulses are easily palpable and motor and sensation are intact to both feet.  There is no erythema.  Skin:    General: Skin is warm and dry.  Neurological:     Mental Status: He is alert and  oriented to person, place, and time.     Coordination: Coordination normal.     ED Results / Procedures / Treatments   Labs (all labs ordered are listed, but only abnormal results are displayed) Labs Reviewed - No data to display  EKG None  Radiology No results found.  Procedures Procedures (including critical care time)  Medications Ordered in ED Medications  sodium chloride 0.9 % bolus 1,000 mL (has no administration in time range)    ED Course  I have reviewed the triage vital signs and the nursing notes.  Pertinent labs & imaging results that were available during my care of the patient were reviewed by me and considered in my medical decision making (see chart for details).    MDM Rules/Calculators/A&P  Patient is a 48 year old male with history of gout presenting with complaints of joint pain.  He describes swelling of his ankles, knees, shoulders.  Patient found to be febrile upon presentation, but has no other symptoms that would explain this fever.  Work-up today shows a negative rapid Covid.  A send out test was obtained and should result in the next 24 hours.  Laboratory studies are essentially unremarkable.  He has a mild leukocytosis, but no source of fever noted on chest x-ray or urinalysis.  Blood cultures are pending.  At this point, I do feel as though the patient is appropriate for discharge.  His vitals have been stable and does not appear septic.  His lactate is normal.  He does appear to be experiencing a flareup of gout.  He has swelling to the both ankles and the left knee, but I highly doubt a septic joint as this is involving multiple joints.  He will be treated with prednisone and medication for pain.  He is to follow-up with his primary doctor and return if symptoms worsen or change.  Final Clinical Impression(s) / ED Diagnoses Final diagnoses:  None    Rx / DC Orders ED Discharge Orders    None       Geoffery Lyons, MD 04/12/19 [phone]

## 2019-04-12 LAB — CBC WITH DIFFERENTIAL/PLATELET
Abs Immature Granulocytes: 0.05 10*3/uL (ref 0.00–0.07)
Basophils Absolute: 0.1 10*3/uL (ref 0.0–0.1)
Basophils Relative: 1 %
Eosinophils Absolute: 0 10*3/uL (ref 0.0–0.5)
Eosinophils Relative: 0 %
HCT: 35.8 % — ABNORMAL LOW (ref 39.0–52.0)
Hemoglobin: 10.9 g/dL — ABNORMAL LOW (ref 13.0–17.0)
Immature Granulocytes: 0 %
Lymphocytes Relative: 11 %
Lymphs Abs: 1.3 10*3/uL (ref 0.7–4.0)
MCH: 25.2 pg — ABNORMAL LOW (ref 26.0–34.0)
MCHC: 30.4 g/dL (ref 30.0–36.0)
MCV: 82.9 fL (ref 80.0–100.0)
Monocytes Absolute: 0.9 10*3/uL (ref 0.1–1.0)
Monocytes Relative: 7 %
Neutro Abs: 10.2 10*3/uL — ABNORMAL HIGH (ref 1.7–7.7)
Neutrophils Relative %: 81 %
Platelets: 526 10*3/uL — ABNORMAL HIGH (ref 150–400)
RBC: 4.32 MIL/uL (ref 4.22–5.81)
RDW: 14.2 % (ref 11.5–15.5)
WBC: 12.6 10*3/uL — ABNORMAL HIGH (ref 4.0–10.5)
nRBC: 0 % (ref 0.0–0.2)

## 2019-04-12 LAB — COMPREHENSIVE METABOLIC PANEL
ALT: 32 U/L (ref 0–44)
AST: 19 U/L (ref 15–41)
Albumin: 4.1 g/dL (ref 3.5–5.0)
Alkaline Phosphatase: 59 U/L (ref 38–126)
Anion gap: 10 (ref 5–15)
BUN: 11 mg/dL (ref 6–20)
CO2: 23 mmol/L (ref 22–32)
Calcium: 9.4 mg/dL (ref 8.9–10.3)
Chloride: 101 mmol/L (ref 98–111)
Creatinine, Ser: 1.22 mg/dL (ref 0.61–1.24)
GFR calc Af Amer: >60 mL/min (ref >60)
GFR calc non Af Amer: >60 mL/min (ref >60)
Glucose, Bld: 117 mg/dL — ABNORMAL HIGH (ref 70–99)
Potassium: 3.8 mmol/L (ref 3.5–5.1)
Sodium: 134 mmol/L — ABNORMAL LOW (ref 135–145)
Total Bilirubin: 0.9 mg/dL (ref 0.3–1.2)
Total Protein: 8.4 g/dL — ABNORMAL HIGH (ref 6.5–8.1)

## 2019-04-12 LAB — URINALYSIS, ROUTINE W REFLEX MICROSCOPIC
Glucose, UA: NEGATIVE mg/dL
Ketones, ur: 40 mg/dL — AB
Leukocytes,Ua: NEGATIVE
Nitrite: NEGATIVE
Protein, ur: NEGATIVE mg/dL
Specific Gravity, Urine: 1.015 (ref 1.005–1.030)
pH: 6 (ref 5.0–8.0)

## 2019-04-12 LAB — URINALYSIS, MICROSCOPIC (REFLEX)

## 2019-04-12 LAB — SARS CORONAVIRUS 2 AG (30 MIN TAT): SARS Coronavirus 2 Ag: NEGATIVE

## 2019-04-12 LAB — LACTIC ACID, PLASMA: Lactic Acid, Venous: 1.1 mmol/L (ref 0.5–1.9)

## 2019-04-12 LAB — SARS CORONAVIRUS 2 (TAT 6-24 HRS): SARS Coronavirus 2: NEGATIVE

## 2019-04-12 MED ORDER — OXYCODONE-ACETAMINOPHEN 5-325 MG PO TABS
2.0000 | ORAL_TABLET | Freq: Once | ORAL | Status: AC
  Administered 2019-04-12: 2 via ORAL
  Filled 2019-04-12: qty 2

## 2019-04-12 MED ORDER — OXYCODONE-ACETAMINOPHEN 5-325 MG PO TABS: 1.0000 | ORAL_TABLET | Freq: Four times a day (QID) | ORAL | 0 refills | Status: AC | PRN

## 2019-04-12 MED ORDER — PREDNISONE 10 MG PO TABS: 20.0000 mg | ORAL_TABLET | Freq: Two times a day (BID) | ORAL | 0 refills | Status: AC

## 2019-04-12 MED ORDER — PREDNISONE 50 MG PO TABS
60.0000 mg | ORAL_TABLET | Freq: Once | ORAL | Status: AC
  Administered 2019-04-12: 60 mg via ORAL
  Filled 2019-04-12: qty 1

## 2019-04-12 NOTE — ED Notes (Signed)
Temp. Of skin has no difference with touch and Pt. Feels all touch to his skin more sensitive to his great toes such as pain he reports.

## 2019-04-12 NOTE — Discharge Instructions (Addendum)
Begin taking prednisone as prescribed.  Begin taking Percocet as prescribed as needed for pain.  Ibuprofen 600 mg every 6 hours as needed for fever.  We will call you if your blood cultures or Covid test are positive and require further attention.  Return to the emergency department in the meantime if you develop difficulty breathing, severe chest pain, or other new and concerning symptoms.

## 2019-04-13 LAB — URINE CULTURE: Culture: NO GROWTH

## 2019-04-17 LAB — CULTURE, BLOOD (ROUTINE X 2)
Culture: NO GROWTH
Culture: NO GROWTH
Special Requests: ADEQUATE
Special Requests: ADEQUATE

## 2020-05-12 IMAGING — DX DG HAND COMPLETE 3+V*R*
3 series · 3 of 3 positions shown · non-contrast
Comparison: None.

CLINICAL DATA: Acute right hand pain and swelling.

EXAM:
RIGHT HAND - COMPLETE 3+ VIEW

[hand pa]
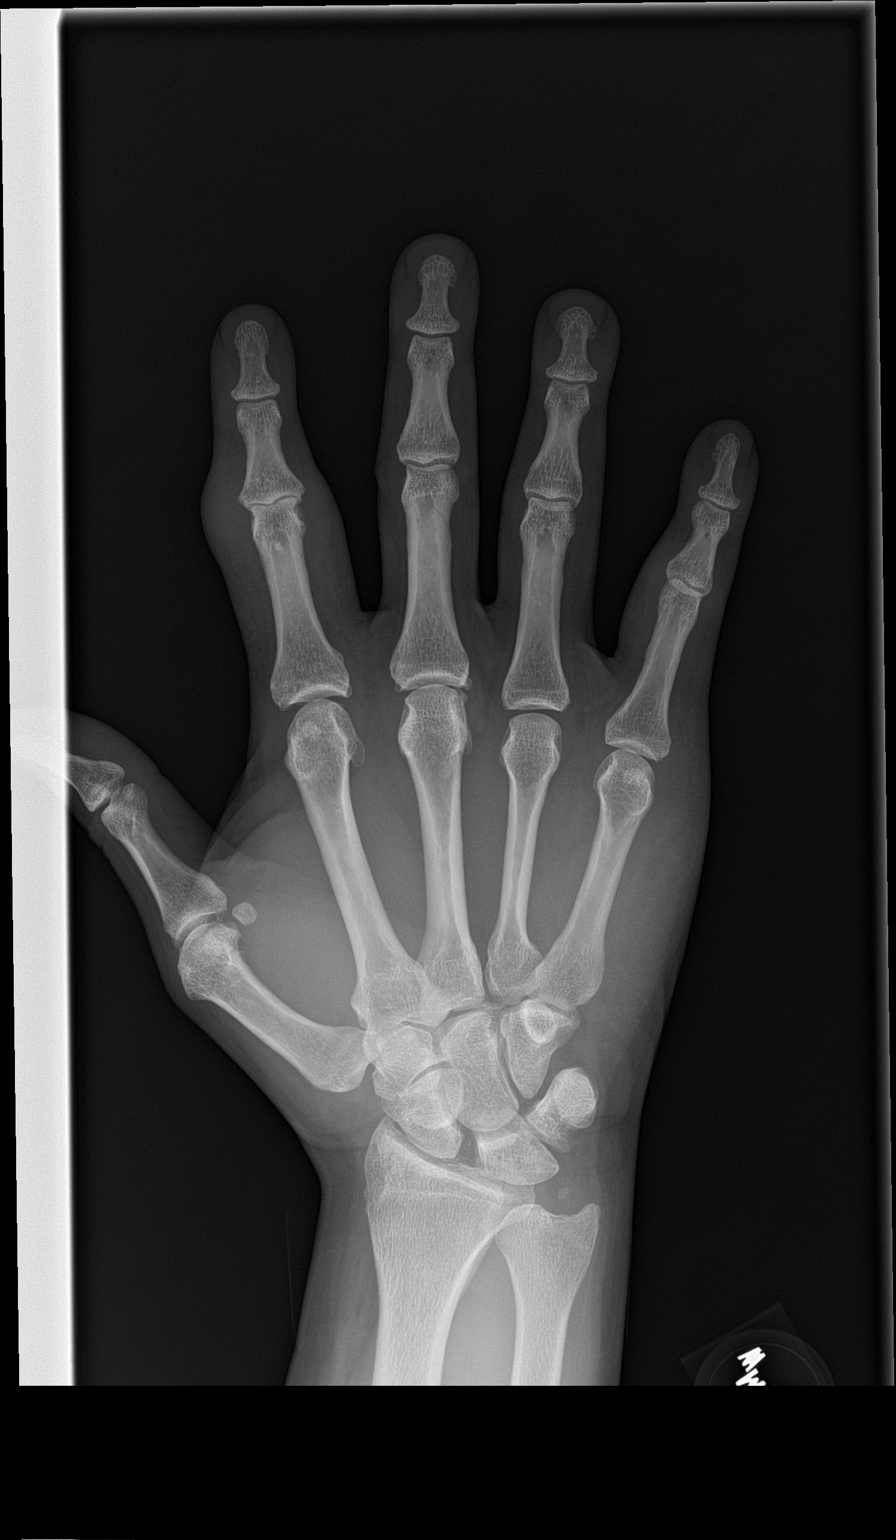

[hand obl]
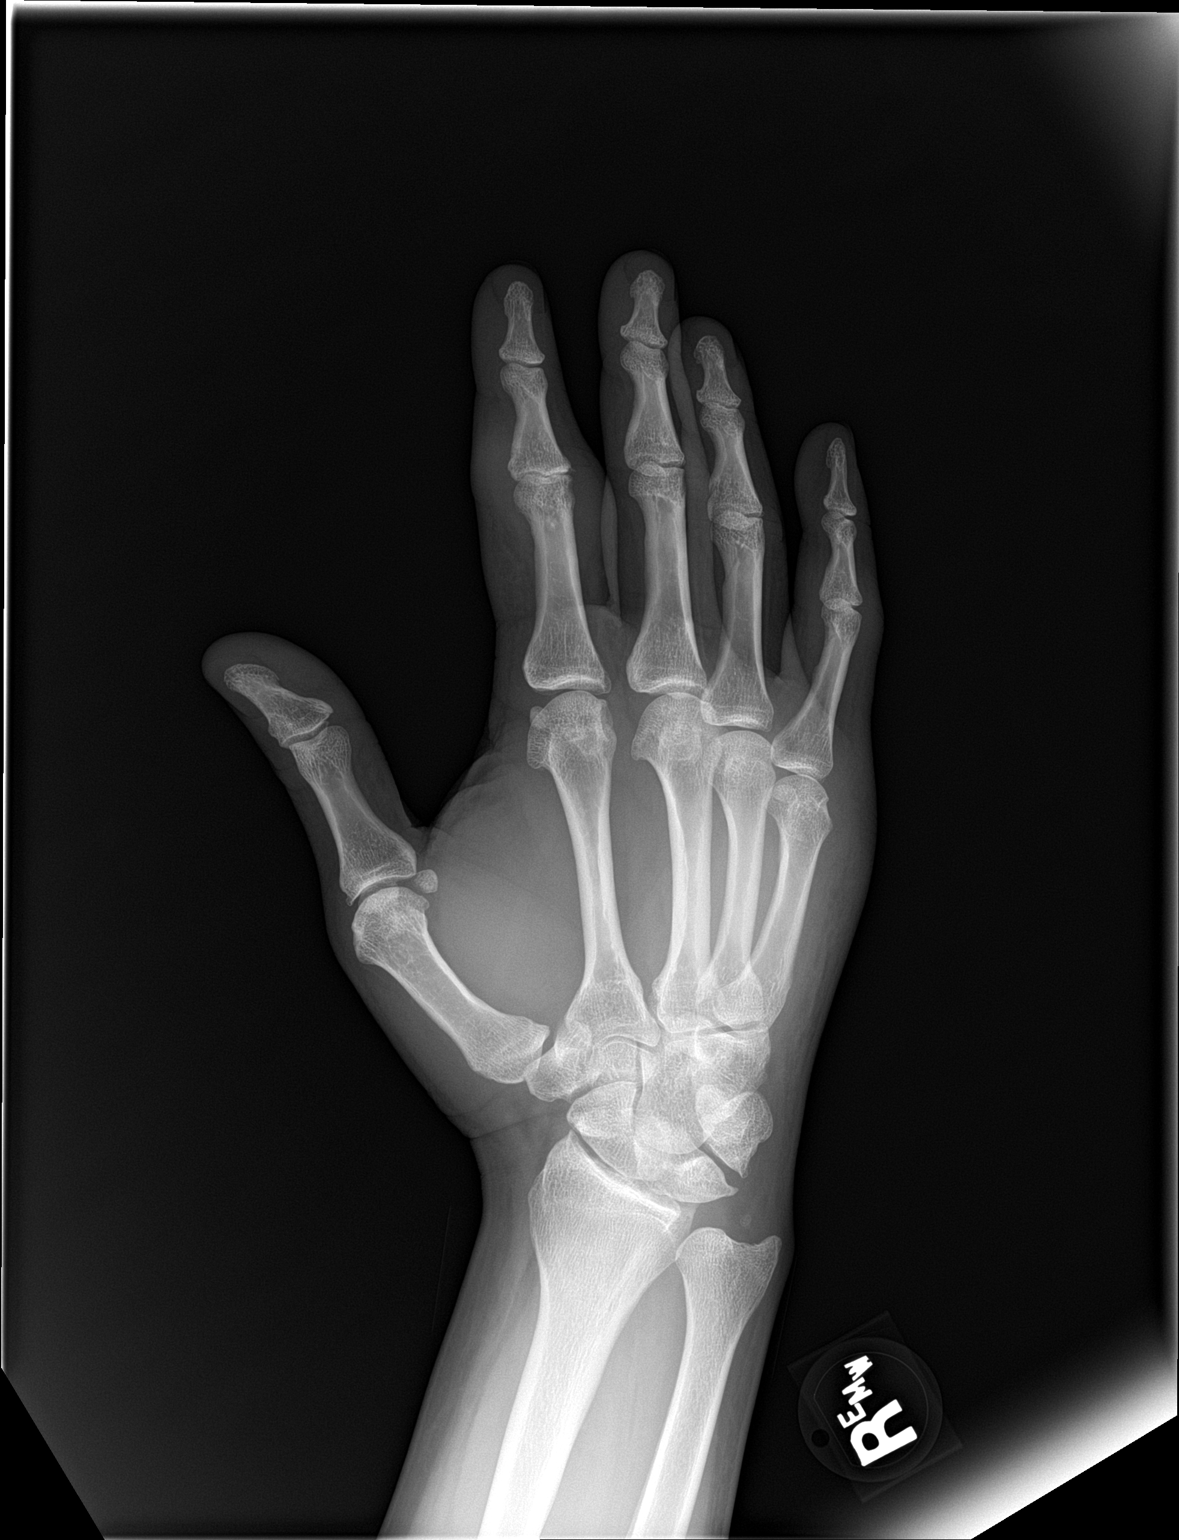

[hand lat]
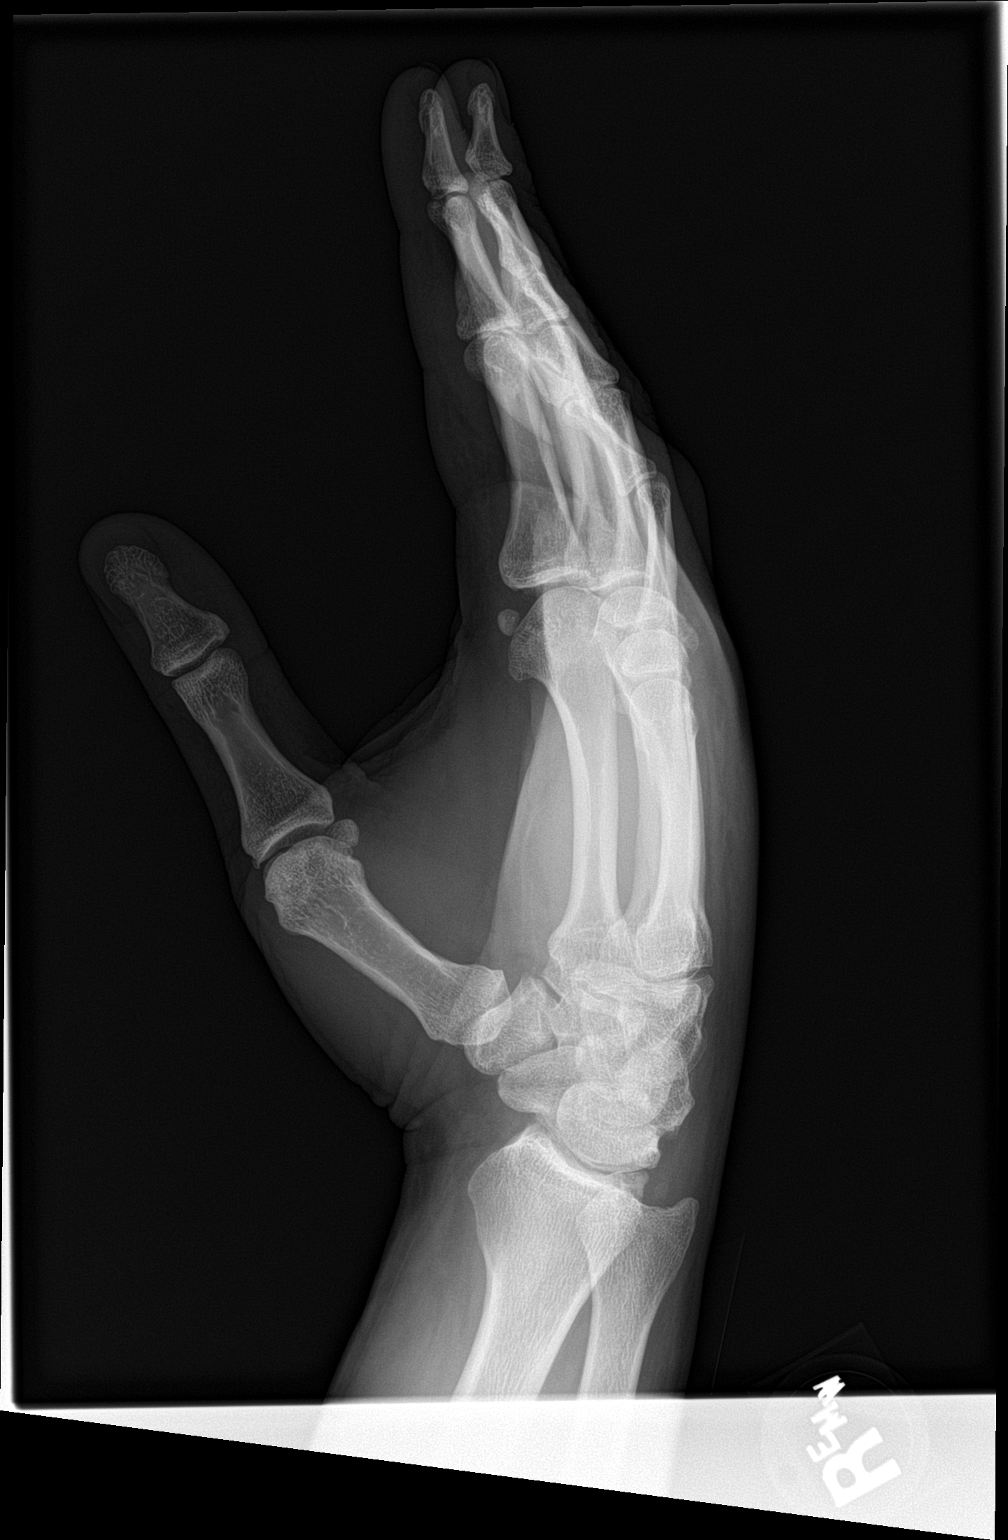

[3 of 3 positions shown; findings below may reference images not displayed]

FINDINGS: There is no evidence of fracture or dislocation. There is no
evidence of arthropathy or other focal bone abnormality. Soft tissue
swelling is noted around the proximal interphalangeal joint of the
index finger.
IMPRESSION: No fracture or dislocation is noted. Soft tissue swelling is noted
around proximal interphalangeal joint of index finger of uncertain
etiology.

## 2022-10-23 ENCOUNTER — Encounter: Payer: Self-pay | Admitting: Physician Assistant

## 2023-10-04 ENCOUNTER — Encounter: Payer: Self-pay | Admitting: Pediatrics

## 2023-11-16 ENCOUNTER — Telehealth: Payer: Self-pay

## 2023-11-16 ENCOUNTER — Encounter

## 2023-11-16 NOTE — Telephone Encounter (Signed)
 No show, mailbox full cannot leave a message.  Called multiple times; no alternate number

## 2023-11-16 NOTE — Telephone Encounter (Signed)
 No Show and no call back to reschedule the PV.  Pre visit and colonoscopy cancelled and letter mailed.

## 2023-11-26 ENCOUNTER — Encounter: Payer: Self-pay | Admitting: Physician Assistant

## 2023-11-29 ENCOUNTER — Encounter: Admitting: Pediatrics

## 2024-01-17 ENCOUNTER — Ambulatory Visit: Admitting: Physician Assistant
